# Patient Record
Sex: Male | Born: 1980 | Race: White | Hispanic: No | Marital: Married | Smoking: Never smoker
Health system: Southern US, Community
[De-identification: ages and names within clinical notes are randomized; demographics above are authoritative.]

## PROBLEM LIST (undated history)

## (undated) DIAGNOSIS — S61019A Laceration without foreign body of unspecified thumb without damage to nail, initial encounter: Secondary | ICD-10-CM

## (undated) DIAGNOSIS — Z8614 Personal history of Methicillin resistant Staphylococcus aureus infection: Secondary | ICD-10-CM

## (undated) DIAGNOSIS — Z87898 Personal history of other specified conditions: Secondary | ICD-10-CM

## (undated) HISTORY — PX: EYE SURGERY: SHX253

---

## 2004-03-20 ENCOUNTER — Emergency Department (HOSPITAL_COMMUNITY): Admission: EM | Admit: 2004-03-20 | Discharge: 2004-03-20 | Payer: Self-pay | Admitting: Emergency Medicine

## 2004-03-20 ENCOUNTER — Emergency Department (HOSPITAL_COMMUNITY): Admission: EM | Admit: 2004-03-20 | Discharge: 2004-03-20 | Payer: Self-pay | Admitting: Family Medicine

## 2004-04-13 ENCOUNTER — Emergency Department (HOSPITAL_COMMUNITY): Admission: EM | Admit: 2004-04-13 | Discharge: 2004-04-13 | Payer: Self-pay | Admitting: Family Medicine

## 2004-04-14 ENCOUNTER — Inpatient Hospital Stay (HOSPITAL_COMMUNITY): Admission: EM | Admit: 2004-04-14 | Discharge: 2004-04-17 | Payer: Self-pay | Admitting: Pulmonary Disease

## 2006-08-25 ENCOUNTER — Ambulatory Visit: Payer: Self-pay | Admitting: Pulmonary Disease

## 2006-08-29 ENCOUNTER — Ambulatory Visit: Payer: Self-pay | Admitting: Pulmonary Disease

## 2006-11-04 ENCOUNTER — Emergency Department (HOSPITAL_COMMUNITY): Admission: EM | Admit: 2006-11-04 | Discharge: 2006-11-04 | Payer: Self-pay | Admitting: Emergency Medicine

## 2007-01-10 ENCOUNTER — Ambulatory Visit: Payer: Self-pay | Admitting: Pulmonary Disease

## 2007-12-20 ENCOUNTER — Ambulatory Visit: Payer: Self-pay | Admitting: Pulmonary Disease

## 2007-12-20 DIAGNOSIS — L723 Sebaceous cyst: Secondary | ICD-10-CM

## 2007-12-20 DIAGNOSIS — L0201 Cutaneous abscess of face: Secondary | ICD-10-CM | POA: Insufficient documentation

## 2007-12-20 DIAGNOSIS — E663 Overweight: Secondary | ICD-10-CM | POA: Insufficient documentation

## 2007-12-20 DIAGNOSIS — F909 Attention-deficit hyperactivity disorder, unspecified type: Secondary | ICD-10-CM | POA: Insufficient documentation

## 2007-12-20 DIAGNOSIS — L03211 Cellulitis of face: Secondary | ICD-10-CM

## 2008-09-10 ENCOUNTER — Ambulatory Visit: Payer: Self-pay | Admitting: Internal Medicine

## 2008-09-10 ENCOUNTER — Ambulatory Visit: Payer: Self-pay | Admitting: Pulmonary Disease

## 2008-09-10 DIAGNOSIS — J209 Acute bronchitis, unspecified: Secondary | ICD-10-CM | POA: Insufficient documentation

## 2008-09-17 ENCOUNTER — Telehealth (INDEPENDENT_AMBULATORY_CARE_PROVIDER_SITE_OTHER): Payer: Self-pay | Admitting: *Deleted

## 2009-11-14 ENCOUNTER — Telehealth (INDEPENDENT_AMBULATORY_CARE_PROVIDER_SITE_OTHER): Payer: Self-pay | Admitting: *Deleted

## 2010-06-08 ENCOUNTER — Ambulatory Visit: Payer: Self-pay | Admitting: Pulmonary Disease

## 2010-09-22 NOTE — Assessment & Plan Note (Signed)
Summary: sore behind ear   CC:  OV - Knot behind left ear for past 3 days - Denies pain to touch.  History of Present Illness: 30 year old male with known history of cellulits, --sebaceous cyst, obesity and ADHD.   4/09--for an acute add on visit... he is getting married in 4 days and has developed a swelling below is right earlobe... denies f/c/s, no drainage, surrounding redness, etc... he has a hx of an infected boil on his forehead turning into a facial cellulitis after I&D at an [facility] in 2005... we had to hospitalize him for several days for IV antibiotics, and it resolved satisfactorily... he also hasa hx of an infected sebaceous cyst on the right side of his neck in the past... this resolved w/ Keflex by mouth...  September 10, 2008--Complains of 4 days of productive cough, congestion, thick mucus, nasal dishcarge. . blood tinged sputum this morning-dry dark blood. w/ rib/back pain. Feels aching., feverish. Denies chest pain, dyspnea, orthopnea, hemoptysis,  n/v/d, edema, recent travel or antibiotics.    June 08, 2010 --Presents for an acute office visit. Complains of knot behind left ear for past 3 days, noticed that a small knot was developing behind the left ear. It is larger today. no redness, sore glands. Ear feels full but no pain. He has hx of recurrent boils/cysts in past. He is concerned has a newborn at home. Denies chest pain, dyspnea, orthopnea, hemoptysis, fever, n/v/d, edema, headache, drainage, rash, sore glands, recent travel, abx use.   Current Medications (verified): 1)  None  Allergies (verified): No Known Drug Allergies  Comments:  Nurse/Medical Assistant: The patient's medications and allergies were reviewed with the patient and were updated in the Medication and Allergy Lists.  Past History:  Past Medical History: Last updated: 09/10/2008 SEBACEOUS CYST, INFECTED (ICD-706.2)--hx of an infected boil on his forehead turning into a facial cellulitis after I&D at  an [facility] in 2005... we had to hospitalize him for several days for IV antibiotics, and it resolved satisfactorily... he also has  hx of an infected sebaceous cyst on the right side of his neck in the past... this resolved w/ Keflex by mouth... Hx of CELLULITIS, FACE (ICD-682.0) Hx of DOG BITE (ICD-E906.0) OVERWEIGHT (ICD-278.02) Hx of ADHD (ICD-314.01)    Review of Systems      See HPI  Vital Signs:  Patient profile:   30 year old male Weight:      256.13 pounds O2 Sat:      96 % on Room air Temp:     97.8 degrees F oral Pulse rate:   68 / minute BP sitting:   122 / 64  (right arm) Cuff size:   large  Vitals Entered By: Abigail Miyamoto RN (June 08, 2010 4:15 PM)  O2 Flow:  Room air  Physical Exam  Additional Exam:   GEN: A/Ox3; pleasant , NAD HEENT:  [place]/AT, , EACs-clear, TMs-wnl, NOSE-clear, THROAT-clear NECK:  Supple w/ fair ROM; no JVD; normal carotid impulses w/o bruits; no thyromegaly or nodules palpated; no lymphadenopathy. CHEST:  Clear to P & A; w/o, wheezes/ rales/ or rhonchi., no wheezing.  HEART:  RRR, no m/r/g   ABDOMEN:  Soft & nt; nml bowel sounds; no organomegaly or masses detected. EXT: Warm bil,  no calf pain, edema, clubbing, pulses intact Skin: along posterior ear w/ soft cyst like lesion  ~0.5cm in size. mobile, not firm, no drainage or redness noted.      Impression & Recommendations:  Problem # 1:  SEBACEOUS CYST, INFECTED (ICD-706.2) Will tx for possible infected sebaceous cyst. hx of recurrent cyst/boils.  May need I/D for resolution, will refer to dermatology for evaluation and possible treatment and help with reoccurence prevention.  Plan:  Keflex 500mg  four times a day for 7 days Clean two times a day w/ soap and water, apply neosporin./cover Warm compresses several times a day.  We are referring you to dermatolgy to evaluate recurrent cyst and boils.  need to set up physical with Dr. Kriste Basque , call back for cpx.  Orders: Dermatology Referral  (Derma) Est. Patient Level IV (45409)  Medications Added to Medication List This Visit: 1)  Keflex 500 Mg Caps (Cephalexin) .Marland Kitchen.. 1 by mouth four times a day  Complete Medication List: 1)  Keflex 500 Mg Caps (Cephalexin) .Marland Kitchen.. 1 by mouth four times a day  Patient Instructions: 1)  Keflex 500mg  four times a day for 7 days 2)  Clean two times a day w/ soap and water, apply neosporin./cover 3)  Warm compresses several times a day.  4)  We are referring you to dermatolgy to evaluate recurrent cyst and boils.  5)  Please contact office for sooner follow up if symptoms do not improve or worsen  Prescriptions: KEFLEX 500 MG CAPS (CEPHALEXIN) 1 by mouth four times a day  #28 x 0   Entered and Authorized by:   Rubye Oaks NP   Signed by:   Rubye Oaks NP on 06/08/2010   Method used:   Electronically to        [facility] [phone]* (retail)       [address]       [place], [place]  [address]       Ph: [phone]       Fax: [phone]   RxID:   [id number]

## 2010-09-22 NOTE — Progress Notes (Signed)
Summary: diarrehea/ vomiting   LMTCBx2  Phone Note Call from Patient Call back at [phone]   Caller: Mom Call For: nadel Summary of Call: pt vomiting fever and diarrhea would like to be seen today. Initial call taken by: Rickard Patience,  November 14, 2009 8:12 AM  Follow-up for Phone Call        [facility].  Aundra Millet Reynolds LPN  November 14, 2009 9:48 AM  [facility] on home number as well as cell number given. Carron Curie CMA  November 14, 2009 4:56 PM   Additional Follow-up for Phone Call Additional follow up Details #1::        called and spoke with pt's mother.  mother states pt is now feeling better. nothing further needed.  Aundra Millet Reynolds LPN  November 18, 2009 10:42 AM

## 2011-01-05 NOTE — Assessment & Plan Note (Signed)
[facility]                             PULMONARY OFFICE NOTE   NAME:[name] Yu, [name] Yu                  MRN:          [id number]  DATE:01/10/2007                            DOB:          March 22, 1981    HISTORY OF PRESENT ILLNESS:  The patient is a 30 year old white male  patient of Dr. Lonzo Cloud. Nadel's, who presents today for a two-week  history of nasal congestion, sinus pain and pressure, a productive cough  with thick yellow sputum and a sore throat.  The patient denies any  hemoptysis, orthopnea, recent trauma or antibiotic use.   PAST MEDICAL HISTORY:  Is reviewed.   CURRENT MEDICATIONS:  Are reviewed.   PHYSICAL EXAMINATION:  GENERAL:  The patient is a pleasant male, in no  acute distress.  VITAL SIGNS:  He is afebrile with stable vital signs.  O2 saturation is  99% on room air.  HEENT:  Nasal mucosa is erythematous.  Maxillary sinus tenderness to  percussion.  Conjunctivae are not injected.  Posterior pharynx with mild  erythema, no exudate.  Tympanic membranes appear slightly full.  NECK:  Supple without adenopathy.  No jugular venous distention.  LUNGS:  Sounds are clear.  HEART:  A regular rate.  ABDOMEN:  Soft, nontender.  EXTREMITIES:  Warm without any edema.  SKIN:  Warm without rash.   IMPRESSION/PLAN:  Acute rhinosinusitis:  The patient is to begin on  Augmentin x10 days.  Add Mucinex DM b.i.d.  Nasal hygiene regimen with  saline and Afrin nasal spray x5 days.   FOLLOWUP:  The patient will return back to the office as scheduled, or  sooner if needed.      Rubye Oaks, NP  Electronically Signed      Lonzo Cloud. Kriste Basque, MD  Electronically Signed   TP/MedQ  DD: 01/10/2007  DT: 01/10/2007  Job #: [id number]

## 2011-01-08 NOTE — Discharge Summary (Signed)
NAMEMICHAIL, [name] Yu                     ACCOUNT NO.:  [id number]   MEDICAL RECORD NO.:  [id number]                   PATIENT TYPE:  INP   LOCATION:  0476                                 FACILITY:  [facility]   PHYSICIAN:  Lonzo Cloud. Kriste Basque, M.D. [facility]            DATE OF BIRTH:  12/29/1980   DATE OF ADMISSION:  04/14/2004  DATE OF DISCHARGE:  04/17/2004                                 DISCHARGE SUMMARY   FINAL DIAGNOSES:  1. Admitted April 14, 2004 from the office with an infected boil on his     forehead and facial cellulitis with severe edema.  Cultures of the wound     and blood were negative (no organism identified).  The patient responded     to IV Unasyn with clinical improvement.  ID consult, Dr. Orvan Falconer,     suggested discharge on 2 weeks of oral doxycycline.  2. History of facial surgery secondary to dog bite in the past.   BRIEF HISTORY AND PHYSICAL:  The patient is a 30 year old white man who has  been in good general health.  Several days prior to admission he noted a  red, swollen knot on his forehead that progressively worsened over several  days.  He went to a local urgent care center where they lanced the lesion  but it drained only a small amount of fluid.  He was placed on oral  Augmentin as well.  He came to the office because of fever and some chills.  He had marked increase in redness around the lesion and down to both eyes  with marked edema to the extent that he could not open his right eye and  could barely see out of his left eye.  He denied any recent trauma.  Denied  any headache or neck pain.  There was no nausea, vomiting, or change in  bowel habits.  He denied any insect or tic exposure.   The patient has enjoyed good general medical health.  He had previously  facial surgery secondary to a dog bite.  He had had a recent sebaceous cyst  drained on his neck and treated with Keflex.  This appeared to have resolved  satisfactorily.  He is on no current  medications.   Physical examination at the time of admission revealed a well-developed,  well-nourished 30 year old white man in no acute distress.  Temperature was  100.2 orally, blood pressure 134/80, pulse 72 per minute and regular,  respirations 16 per minute and not labored, O2 saturation 100% on room air.  He weighed 206 pounds.  HEENT exam revealed a red boil to the right of  midline in his forehead area.  This was somewhat fluctuant and very tender.  He had marked facial cellulitis extending down to the malar area.  He had  severe edema about the eyes and the right eyelid was swollen shut.  The left  eyelid was open sufficiently for him to see  and his vision was normal.  There was no palpable adenopathy.  Sinuses appeared slightly tender on  palpation.  Ears were negative.  No neck adenopathy.  Chest was clear to  percussion and auscultation.  Cardiac exam revealed a regular rhythm without  murmurs, rubs, or gallops heard.  The abdomen was soft and nontender without  evidence of organomegaly or masses.  Extremities showed no cyanosis,  clubbing, or edema.  There were no other skin lesions noted.  Neurologic  exam was intact.   LABORATORY DATA:  A 12-lead EKG showed normal sinus rhythm and was within  normal limits.  Hemoglobin 14.3; hematocrit 42.9; white count 12,700 with  80% segs.  Sed rate 13.  Sodium 137.  Potassium 3.2, repeat 4.2 after  potassium supplementation.  Chloride 102, CO2 28, BUN 9, creatinine 1.1.  Blood sugar 149, repeat 100.  Calcium 9.4, total protein 7.2, albumin 3.8,  AST 45, ALT 52, alk phos 72, total bilirubin 0.9.  Hemoglobin A1c 5.1.  Two  blood cultures negative and culture of the wound no growth.   HOSPITAL COURSE:  The patient was admitted with facial cellulitis from a  boil on his right forehead.  This was treated with warm soaks and Neosporin.  He was started on IV Unasyn at 1.5 g IV q.6h.  Over the next 2 days his  temperature returned to normal  and his white count improved on follow-up.  The facial cellulitis resolved and the edema disappeared.  We had  consultation from Dr. Cliffton Asters of infectious disease.  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  He recommended stopping the  Unasyn and switching to oral doxycycline for a 2-week course.  The patient  was also briefly evaluated by Dr. Darnell Level of the surgery service.  He  did not feel that this lesion needed further incision and drainage or  excision, and that it should be followed carefully on the doxycycline.   MEDICATIONS AT DISCHARGE:  Doxycycline 100 mg p.o. b.i.d. for 2 weeks.   The patient instructed to keep the lesion clean and cover it with a small  amount of Neosporin twice a day.  He will call us if the lesion gets worse  in any way, and otherwise will see him back in the office in 2 weeks to  ensure resolution of the problem.   CONDITION ON DISCHARGE:  Improved.                                               Lonzo Cloud. Kriste Basque, M.D. [facility]    SMN/MEDQ  D:  04/17/2004  T:  04/17/2004  Job:  [id number]

## 2011-01-08 NOTE — H&P (Signed)
NAMELARNCE, [name] Yu                       ACCOUNT NO.:  [id number]   MEDICAL RECORD NO.:  [id number]                   PATIENT TYPE:   LOCATION:                                       FACILITY:  [facility]   PHYSICIAN:  Lonzo Cloud. Kriste Yu, M.D. [facility]            DATE OF BIRTH:  1980-08-31   DATE OF ADMISSION:  04/14/2004  DATE OF DISCHARGE:                                HISTORY & PHYSICAL   CHIEF COMPLAINT:  Infected cyst on the forehead x3 days.   HISTORY OF PRESENT ILLNESS:  This is a 30 year old white male patient of Dr.  Kriste Yu who was in his usual good state of health up until 3 days ago.  Patient complains that he had noticed a red swollen knot on his forehead  that progressively worsened over the weekend.  Patient subsequently went to  a local Urgent Care last evening in which they performed an incision and  drainage and prescribed Augmentin.  Patient took approximately two doses  last evening and symptoms have worsened today.  The knot on his forehead has  enlarged and he has worsened redness that has spread down into his eyes.  Patient woke up this morning with his eyes swollen shut.  He has had fever,  chills, sweats with a T-max of 100.2.  Patient denies any known injury,  recent travel, headache, neck pain, nausea, vomiting, insect or tick bite.   PAST MEDICAL HISTORY:  Previous facial surgery secondary to a dog bite.  Patient has had a recent sebaceous cyst on the neck which did require  incision and drainage in August.  He was treated with antibiotics and this  healed up nicely.   CURRENT MEDICATIONS:  None.   DRUG ALLERGIES:  No known drug allergies.   SOCIAL HISTORY:  Patient is single.  He works in [employer].  Lives at  home with his mom and dad.  Denies any alcohol, drugs, or smoking.   REVIEW OF SYSTEMS:  Essentially negative except as above.   PHYSICAL EXAMINATION:  GENERAL:  Patient is a well-developed, well-nourished  white male in no acute distress.  VITAL  SIGNS:  Temperature 100.2, blood pressure 134/82, respiratory rate 16,  heart rate 73, weight is at 206.  O2 saturation is 100% on room air.  HEENT:  Patient has a red swollen nodule along the right to mid forehead.  There is significant erythema and edema surrounding this.  Upper eyelids are  swollen and erythematous and hot and tender to touch.  PERRL.  EOMI without  nystagmus.  Nasal mucosa is pink and moist.  TMs are normal.  Posterior  pharynx is clear without any exudate or swelling.  NECK:  Supple without cervical adenopathy.  No JVD.  Negative nuchal  rigidity.  LUNGS:  Lung sounds are clear to auscultation bilaterally without any  wheezing or crackles.  CARDIAC:  S1, S2 without murmur, rub, or gallop.  ABDOMEN:  Soft with  positive bowel sounds through all four quadrants.  No  hepatosplenomegaly.  No guarding or rebound noted.  EXTREMITIES:  Warm without any calf tenderness, cyanosis, clubbing, or  edema.  SKIN:  Warm without any rash.  NEUROLOGIC:  Alert and oriented x3.   IMPRESSION AND PLAN:  Facial __________ secondary to an infected sebaceous  cyst.  Patient will be admitted for IV antibiotics, Unasyn.  Laboratories  and blood culture and wound cultures are currently pending.  We will contact  infectious disease for a consult at their convenience.     Tammy Parrett, P.A. [facility]                   [name] Yu, M.D. [facility]    TP/MEDQ  D:  04/14/2004  T:  04/14/2004  Job:  [id number]

## 2011-01-08 NOTE — Assessment & Plan Note (Signed)
[facility]                             PULMONARY OFFICE NOTE   NAME:Ciszewski, ALANI LACIVITA                  MRN:          [id number]  DATE:08/25/2006                            DOB:          November 01, 1980    HISTORY OF PRESENT ILLNESS:  The patient is a 30 year old white male  patient of Dr. Jodelle Green who presents for an acute office visit.  The  patient reports he was in his general state of good health up until  yesterday when he noticed a small, tender sore on the right side of his  neck.  The patient has had a previous history of infected sebaceous cyst  that required refractory to outpatient antibiotics.  The patient  required hospitalization for IV antibiotics and excision in August of  2005.  The patient has had no reoccurrence until present.  The patient  has noticed a small tender reddened sore along the right side of his  neck yesterday.  The patient denies any fever, chest pain, shortness of  breath, neck stiffness, nausea, vomiting, or rash.   PAST MEDICAL HISTORY:  Reviewed.   CURRENT MEDICATIONS:  Reviewed.   PHYSICAL EXAMINATION:  The patient is a pleasant male in no acute  distress.  He is afebrile, stable vital signs, O2 saturation is 99% on  room air.  HEENT:  Posterior pharynx is clear. TMs are normal.  NECK:  Supple.  The patient has a small reddened tender nodule on the  right lateral neck measuring 1 sonometer x 1 sonometer.  There is some  surrounding erythema and some posterior cervical adenopathy.  Negative  nuchal rigidity.  LUNG:  Sounds are clear.  CARDIAC:  Regular rate and rhythm.  ABDOMEN:  Soft without any hepatosplenomegaly.  EXTREMITIES:  Warm without any edema.  SKIN:  Warm without rash.   IMPRESSION/PLAN:  Infected sebaceous cyst along the right lateral neck.  The patient is to being Duricef 500 mg twice daily times 7 days.  Warm  compresses to the area p.r.n.  Patient is to wash area with soap and  water and keep  clean and dry.  May apply Neosporin daily.  The  patient will recheck here in 4 days or sooner if needed.  The patient  may need referral to surgeon for I/D if it continues to enlarge.     Rubye Oaks, NP  Electronically Signed      Lonzo Cloud. Kriste Basque, MD  Electronically Signed   TP/MedQ  DD: 08/25/2006  DT: 08/25/2006  Job #: [id number]

## 2016-09-09 ENCOUNTER — Emergency Department (HOSPITAL_BASED_OUTPATIENT_CLINIC_OR_DEPARTMENT_OTHER)
Admission: EM | Admit: 2016-09-09 | Discharge: 2016-09-09 | Disposition: A | Discharge status: Home / Self Care | Payer: BC Managed Care – PPO | Attending: Emergency Medicine | Admitting: Emergency Medicine

## 2016-09-09 ENCOUNTER — Emergency Department (HOSPITAL_BASED_OUTPATIENT_CLINIC_OR_DEPARTMENT_OTHER): Payer: BC Managed Care – PPO

## 2016-09-09 ENCOUNTER — Encounter (HOSPITAL_BASED_OUTPATIENT_CLINIC_OR_DEPARTMENT_OTHER): Payer: Self-pay

## 2016-09-09 DIAGNOSIS — Y92009 Unspecified place in unspecified non-institutional (private) residence as the place of occurrence of the external cause: Secondary | ICD-10-CM | POA: Insufficient documentation

## 2016-09-09 DIAGNOSIS — S62522A Displaced fracture of distal phalanx of left thumb, initial encounter for closed fracture: Secondary | ICD-10-CM | POA: Insufficient documentation

## 2016-09-09 DIAGNOSIS — Y939 Activity, unspecified: Secondary | ICD-10-CM | POA: Diagnosis not present

## 2016-09-09 DIAGNOSIS — S62525B Nondisplaced fracture of distal phalanx of left thumb, initial encounter for open fracture: Secondary | ICD-10-CM

## 2016-09-09 DIAGNOSIS — Y999 Unspecified external cause status: Secondary | ICD-10-CM | POA: Insufficient documentation

## 2016-09-09 DIAGNOSIS — S61019A Laceration without foreign body of unspecified thumb without damage to nail, initial encounter: Secondary | ICD-10-CM

## 2016-09-09 DIAGNOSIS — W010XXA Fall on same level from slipping, tripping and stumbling without subsequent striking against object, initial encounter: Secondary | ICD-10-CM | POA: Diagnosis not present

## 2016-09-09 DIAGNOSIS — S6992XA Unspecified injury of left wrist, hand and finger(s), initial encounter: Secondary | ICD-10-CM | POA: Diagnosis present

## 2016-09-09 HISTORY — DX: Laceration without foreign body of unspecified thumb without damage to nail, initial encounter: S61.019A

## 2016-09-09 MED ORDER — TETANUS-DIPHTH-ACELL PERTUSSIS 5-2.5-18.5 LF-MCG/0.5 IM SUSP
0.5000 mL | Freq: Once | INTRAMUSCULAR | Status: AC
  Administered 2016-09-09: 0.5 mL via INTRAMUSCULAR
  Filled 2016-09-09: qty 0.5

## 2016-09-09 MED ORDER — HYDROCODONE-ACETAMINOPHEN 5-325 MG PO TABS
2.0000 | ORAL_TABLET | Freq: Once | ORAL | Status: AC
  Administered 2016-09-09: 2 via ORAL
  Filled 2016-09-09: qty 2

## 2016-09-09 MED ORDER — LIDOCAINE HCL (PF) 1 % IJ SOLN
INTRAMUSCULAR | Status: AC
  Administered 2016-09-09: 5 mL
  Filled 2016-09-09: qty 5

## 2016-09-09 MED ORDER — HYDROCODONE-ACETAMINOPHEN 5-325 MG PO TABS: 1.0000 | ORAL_TABLET | Freq: Four times a day (QID) | ORAL | 0 refills | Status: AC | PRN

## 2016-09-09 MED ORDER — CEPHALEXIN 500 MG PO CAPS: 500.0000 mg | ORAL_CAPSULE | Freq: Four times a day (QID) | ORAL | 0 refills | Status: AC

## 2016-09-09 MED FILL — CEPHALEXIN 500 MG CAPSULE: 500 | 7 days supply | Qty: 28 | Fill #0

## 2016-09-09 MED FILL — HYDROCODON-APAP 5-325: 5-325 | 2 days supply | Qty: 20 | Fill #0

## 2016-09-09 NOTE — ED Provider Notes (Signed)
[facility] Provider Note   CSN: [id number] Arrival date & time: 09/09/16  1230     History   Chief Complaint Chief Complaint  Patient presents with  . Hand Injury    HPI [name] Yu is a 36 y.o. male.  Patient is a 36 year old male with no significant past medical history. He presents for evaluation of a thumb injury. He reports shoveling the driveway, and slipping on ice. He fell and sliced his thumb on the snow shovel.   The history is provided by the patient.  Hand Injury   The incident occurred less than 1 hour ago. The incident occurred at home. The injury mechanism was a fall. Pain location: Left thumb. The pain is moderate. The pain has been constant since the incident. He reports no foreign bodies present. The symptoms are aggravated by movement. He has tried nothing for the symptoms.    History reviewed. No pertinent past medical history.  Patient Active Problem List   Diagnosis Date Noted  . BRONCHITIS, ACUTE 09/10/2008  . OVERWEIGHT 12/20/2007  . ADHD 12/20/2007  . CELLULITIS, FACE 12/20/2007  . SEBACEOUS CYST, INFECTED 12/20/2007    History reviewed. No pertinent surgical history.     Home Medications    Prior to Admission medications   Not on File    Family History No family history on file.  Social History Social History  Substance Use Topics  . Smoking status: Never Smoker  . Smokeless tobacco: Never Used  . Alcohol use Yes     Comment: occ     Allergies   Patient has no known allergies.   Review of Systems Review of Systems  All other systems reviewed and are negative.    Physical Exam Updated Vital Signs BP 140/98 (BP Location: Right Arm)   Pulse 86   Temp 98.4 F (36.9 C) (Oral)   Resp 20   Ht 6\' 1"  (1.854 m)   Wt 275 lb (124.7 kg)   SpO2 98%   BMI 36.28 kg/m   Physical Exam  Constitutional: He is oriented to person, place, and time. He appears well-developed and well-nourished. No distress.    HENT:  Head: Normocephalic and atraumatic.  Neck: Normal range of motion. Neck supple.  Musculoskeletal: Normal range of motion.  The left thumb has a 1.5 cm laceration with arterial bleeding overlying lateral aspect of the IP joint.  Neurological: He is alert and oriented to person, place, and time.  Skin: Skin is warm and dry. He is not diaphoretic.  Nursing note and vitals reviewed.    ED Treatments / Results  Labs (all labs ordered are listed, but only abnormal results are displayed) Labs Reviewed - No data to display  EKG  EKG Interpretation None       Radiology No results found.  Procedures Procedures (including critical care time)  Medications Ordered in ED Medications - No data to display   Initial Impression / Assessment and Plan / ED Course  I have reviewed the triage vital signs and the nursing notes.  Pertinent labs & imaging results that were available during my care of the patient were reviewed by me and considered in my medical decision making (see chart for details).  Patient presents here with complaints of a thumb injury. He lacerated his thumb while shoveling snow. He slipped on the ice and landed on the sharp edge of his snow shovel. He presented here with a laceration over the IP joint with arterial bleeding. X-rays  reveal a fracture of the distal phalanx.  I spoke with Dr. Merlyn LotKuzma from hand surgery regarding this injury. He feels as though closure of the wound with irrigation and follow-up in the office is appropriate. He will be treated with Keflex, hydrocodone, local wound care, and follow-up tomorrow.  LACERATION REPAIR Performed by: Geoffery LyonseLo, Kharisma Glasner Authorized by: Geoffery LyonseLo, Ralph Benavidez Consent: Verbal consent obtained. Risks and benefits: risks, benefits and alternatives were discussed Consent given by: patient Patient identity confirmed: provided demographic data Prepped and Draped in normal sterile fashion Wound explored  Laceration Location: Left  thumb  Laceration Length: 2.5 cm  No Foreign Bodies seen or palpated  Anesthesia: local infiltration  Local anesthetic: lidocaine 1 % without epinephrine  Anesthetic total: 3 ml  Irrigation method: syringe Amount of cleaning: standard  Skin closure: 4-0 Ethilon   Number of sutures: 5   Technique: Simple interrupted   Patient tolerance: Patient tolerated the procedure well with no immediate complications.   Final Clinical Impressions(s) / ED Diagnoses   Final diagnoses:  None    New Prescriptions New Prescriptions   No medications on file     Geoffery Lyonsouglas Prairie Stenberg, MD 09/09/16 1521

## 2016-09-09 NOTE — ED Triage Notes (Signed)
Cut left thumb on shovel approx 10 min PTA-NAD-steady gait

## 2016-09-09 NOTE — Discharge Instructions (Signed)
Dressing and splint as applied until followed up by hand surgery.  You are to follow-up with Dr. Merlyn LotKuzma tomorrow. Please call this afternoon or early tomorrow to arrange this appointment. His contact information has been provided in this discharge summary.  Keflex as prescribed.  Hydrocodone as prescribed as needed for pain.

## 2016-09-10 ENCOUNTER — Encounter (HOSPITAL_BASED_OUTPATIENT_CLINIC_OR_DEPARTMENT_OTHER): Payer: Self-pay | Admitting: *Deleted

## 2016-09-14 ENCOUNTER — Encounter (HOSPITAL_BASED_OUTPATIENT_CLINIC_OR_DEPARTMENT_OTHER)
Admission: RE | Disposition: A | Discharge status: Home / Self Care | Payer: Self-pay | Source: Ambulatory Visit | Attending: Orthopedic Surgery

## 2016-09-14 ENCOUNTER — Encounter (HOSPITAL_BASED_OUTPATIENT_CLINIC_OR_DEPARTMENT_OTHER): Payer: Self-pay | Admitting: Anesthesiology

## 2016-09-14 ENCOUNTER — Ambulatory Visit (HOSPITAL_BASED_OUTPATIENT_CLINIC_OR_DEPARTMENT_OTHER)
Admission: RE | Admit: 2016-09-14 | Discharge: 2016-09-14 | Disposition: A | Discharge status: Home / Self Care | Payer: BC Managed Care – PPO | Source: Ambulatory Visit | Attending: Orthopedic Surgery | Admitting: Orthopedic Surgery

## 2016-09-14 ENCOUNTER — Ambulatory Visit (HOSPITAL_BASED_OUTPATIENT_CLINIC_OR_DEPARTMENT_OTHER): Payer: BC Managed Care – PPO | Admitting: Anesthesiology

## 2016-09-14 DIAGNOSIS — Y93H1 Activity, digging, shoveling and raking: Secondary | ICD-10-CM | POA: Diagnosis not present

## 2016-09-14 DIAGNOSIS — S65112A Laceration of radial artery at wrist and hand level of left arm, initial encounter: Secondary | ICD-10-CM | POA: Diagnosis not present

## 2016-09-14 DIAGNOSIS — S61012A Laceration without foreign body of left thumb without damage to nail, initial encounter: Secondary | ICD-10-CM | POA: Diagnosis present

## 2016-09-14 DIAGNOSIS — Z6835 Body mass index (BMI) 35.0-35.9, adult: Secondary | ICD-10-CM | POA: Insufficient documentation

## 2016-09-14 DIAGNOSIS — W000XXA Fall on same level due to ice and snow, initial encounter: Secondary | ICD-10-CM | POA: Diagnosis not present

## 2016-09-14 DIAGNOSIS — S63682A Other sprain of left thumb, initial encounter: Secondary | ICD-10-CM | POA: Insufficient documentation

## 2016-09-14 DIAGNOSIS — Y92008 Other place in unspecified non-institutional (private) residence as the place of occurrence of the external cause: Secondary | ICD-10-CM | POA: Diagnosis not present

## 2016-09-14 DIAGNOSIS — S66022A Laceration of long flexor muscle, fascia and tendon of left thumb at wrist and hand level, initial encounter: Secondary | ICD-10-CM | POA: Insufficient documentation

## 2016-09-14 DIAGNOSIS — S62522B Displaced fracture of distal phalanx of left thumb, initial encounter for open fracture: Secondary | ICD-10-CM | POA: Diagnosis not present

## 2016-09-14 DIAGNOSIS — Z8614 Personal history of Methicillin resistant Staphylococcus aureus infection: Secondary | ICD-10-CM | POA: Diagnosis not present

## 2016-09-14 DIAGNOSIS — S6432XA Injury of digital nerve of left thumb, initial encounter: Secondary | ICD-10-CM | POA: Diagnosis not present

## 2016-09-14 DIAGNOSIS — E669 Obesity, unspecified: Secondary | ICD-10-CM | POA: Insufficient documentation

## 2016-09-14 HISTORY — DX: Laceration without foreign body of unspecified thumb without damage to nail, initial encounter: S61.019A

## 2016-09-14 HISTORY — DX: Personal history of other specified conditions: Z87.898

## 2016-09-14 HISTORY — DX: Personal history of Methicillin resistant Staphylococcus aureus infection: Z86.14

## 2016-09-14 HISTORY — PX: NERVE, TENDON AND ARTERY REPAIR: SHX5695

## 2016-09-14 SURGERY — NERVE, TENDON AND ARTERY REPAIR
Anesthesia: General | Site: Thumb | Laterality: Left

## 2016-09-14 MED ORDER — FENTANYL CITRATE (PF) 100 MCG/2ML IJ SOLN
INTRAMUSCULAR | Status: AC
  Filled 2016-09-14: qty 2

## 2016-09-14 MED ORDER — LIDOCAINE 2% (20 MG/ML) 5 ML SYRINGE
INTRAMUSCULAR | Status: DC | PRN
  Administered 2016-09-14: 100 mg via INTRAVENOUS

## 2016-09-14 MED ORDER — OXYCODONE HCL 5 MG/5ML PO SOLN: 5.0000 mg | Freq: Once | ORAL | Status: DC | PRN

## 2016-09-14 MED ORDER — FENTANYL CITRATE (PF) 100 MCG/2ML IJ SOLN
50.0000 ug | INTRAMUSCULAR | Status: AC | PRN
  Administered 2016-09-14: 50 ug via INTRAVENOUS
  Administered 2016-09-14: 100 ug via INTRAVENOUS
  Administered 2016-09-14: 50 ug via INTRAVENOUS

## 2016-09-14 MED ORDER — HYDROMORPHONE HCL 1 MG/ML IJ SOLN: 0.2500 mg | INTRAMUSCULAR | Status: DC | PRN

## 2016-09-14 MED ORDER — MIDAZOLAM HCL 2 MG/2ML IJ SOLN
1.0000 mg | INTRAMUSCULAR | Status: DC | PRN
  Administered 2016-09-14: 2 mg via INTRAVENOUS

## 2016-09-14 MED ORDER — DEXAMETHASONE SODIUM PHOSPHATE 10 MG/ML IJ SOLN
INTRAMUSCULAR | Status: DC | PRN
Start: 2016-09-14 — End: 2016-09-14
  Administered 2016-09-14: 10 mg via INTRAVENOUS

## 2016-09-14 MED ORDER — OXYCODONE HCL 5 MG PO TABS: 5.0000 mg | ORAL_TABLET | Freq: Once | ORAL | Status: DC | PRN

## 2016-09-14 MED ORDER — CEFAZOLIN SODIUM-DEXTROSE 2-4 GM/100ML-% IV SOLN
INTRAVENOUS | Status: AC
  Filled 2016-09-14: qty 100

## 2016-09-14 MED ORDER — HYDROCODONE-ACETAMINOPHEN 5-325 MG PO TABS: ORAL_TABLET | ORAL | 0 refills | Status: AC

## 2016-09-14 MED ORDER — MIDAZOLAM HCL 2 MG/2ML IJ SOLN
INTRAMUSCULAR | Status: AC
  Filled 2016-09-14: qty 2

## 2016-09-14 MED ORDER — PROMETHAZINE HCL 25 MG/ML IJ SOLN: 6.2500 mg | INTRAMUSCULAR | Status: DC | PRN

## 2016-09-14 MED ORDER — BUPIVACAINE HCL (PF) 0.25 % IJ SOLN
INTRAMUSCULAR | Status: DC | PRN
  Administered 2016-09-14: 10 mL

## 2016-09-14 MED ORDER — CHLORHEXIDINE GLUCONATE 4 % EX LIQD: 60.0000 mL | Freq: Once | CUTANEOUS | Status: DC

## 2016-09-14 MED ORDER — DEXTROSE 5 % IV SOLN
3.0000 g | INTRAVENOUS | Status: AC
  Administered 2016-09-14: 3 g via INTRAVENOUS

## 2016-09-14 MED ORDER — CEFAZOLIN IN D5W 1 GM/50ML IV SOLN
INTRAVENOUS | Status: AC
  Filled 2016-09-14: qty 50

## 2016-09-14 MED ORDER — ONDANSETRON HCL 4 MG/2ML IJ SOLN
INTRAMUSCULAR | Status: DC | PRN
  Administered 2016-09-14: 4 mg via INTRAVENOUS

## 2016-09-14 MED ORDER — PROPOFOL 10 MG/ML IV BOLUS
INTRAVENOUS | Status: DC | PRN
  Administered 2016-09-14: 200 mg via INTRAVENOUS

## 2016-09-14 MED ORDER — LACTATED RINGERS IV SOLN
INTRAVENOUS | Status: DC
  Administered 2016-09-14 (×2): via INTRAVENOUS

## 2016-09-14 MED ORDER — MEPERIDINE HCL 25 MG/ML IJ SOLN: 6.2500 mg | INTRAMUSCULAR | Status: DC | PRN

## 2016-09-14 MED ORDER — SCOPOLAMINE 1 MG/3DAYS TD PT72
1.0000 | MEDICATED_PATCH | Freq: Once | TRANSDERMAL | Status: DC | PRN
Start: 2016-09-14 — End: 2016-09-14

## 2016-09-14 SURGICAL SUPPLY — 66 items
BAG DECANTER FOR FLEXI CONT (MISCELLANEOUS) IMPLANT
BANDAGE ACE 3X5.8 VEL STRL LF (GAUZE/BANDAGES/DRESSINGS) ×2 IMPLANT
BLADE MINI RND TIP GREEN BEAV (BLADE) IMPLANT
BLADE SURG 15 STRL LF DISP TIS (BLADE) ×2 IMPLANT
BLADE SURG 15 STRL SS (BLADE) ×4
BNDG CMPR 9X4 STRL LF SNTH (GAUZE/BANDAGES/DRESSINGS) ×1
BNDG ESMARK 4X9 LF (GAUZE/BANDAGES/DRESSINGS) ×1 IMPLANT
BNDG GAUZE ELAST 4 BULKY (GAUZE/BANDAGES/DRESSINGS) ×2 IMPLANT
BRUSH SCRUB EZ PLAIN DRY (MISCELLANEOUS) ×2 IMPLANT
CATH ROBINSON RED A/P 10FR (CATHETERS) IMPLANT
CHLORAPREP W/TINT 26ML (MISCELLANEOUS) ×2 IMPLANT
CORDS BIPOLAR (ELECTRODE) ×2 IMPLANT
COVER BACK TABLE 60X90IN (DRAPES) ×2 IMPLANT
COVER MAYO STAND STRL (DRAPES) ×2 IMPLANT
CUFF TOURNIQUET SINGLE 18IN (TOURNIQUET CUFF) ×1 IMPLANT
DECANTER SPIKE VIAL GLASS SM (MISCELLANEOUS) ×2 IMPLANT
DRAPE EXTREMITY T 121X128X90 (DRAPE) ×2 IMPLANT
DRAPE SURG 17X23 STRL (DRAPES) ×2 IMPLANT
GAUZE SPONGE 4X4 12PLY STRL (GAUZE/BANDAGES/DRESSINGS) ×2 IMPLANT
GAUZE XEROFORM 1X8 LF (GAUZE/BANDAGES/DRESSINGS) ×2 IMPLANT
GLOVE BIO SURGEON STRL SZ 6.5 (GLOVE) ×1 IMPLANT
GLOVE BIO SURGEON STRL SZ7.5 (GLOVE) ×2 IMPLANT
GLOVE BIOGEL PI IND STRL 7.0 (GLOVE) IMPLANT
GLOVE BIOGEL PI IND STRL 8 (GLOVE) ×1 IMPLANT
GLOVE BIOGEL PI IND STRL 8.5 (GLOVE) IMPLANT
GLOVE BIOGEL PI INDICATOR 7.0 (GLOVE) ×2
GLOVE BIOGEL PI INDICATOR 8 (GLOVE) ×1
GLOVE BIOGEL PI INDICATOR 8.5 (GLOVE) ×2
GLOVE SURG ORTHO 8.0 STRL STRW (GLOVE) ×1 IMPLANT
GOWN STRL REUS W/ TWL LRG LVL3 (GOWN DISPOSABLE) ×1 IMPLANT
GOWN STRL REUS W/TWL LRG LVL3 (GOWN DISPOSABLE) ×2
GOWN STRL REUS W/TWL XL LVL3 (GOWN DISPOSABLE) ×2 IMPLANT
LOOP VESSEL MAXI BLUE (MISCELLANEOUS) IMPLANT
NDL HYPO 25X1 1.5 SAFETY (NEEDLE) IMPLANT
NDL SAFETY ECLIPSE 18X1.5 (NEEDLE) IMPLANT
NEEDLE HYPO 18GX1.5 SHARP (NEEDLE)
NEEDLE HYPO 25X1 1.5 SAFETY (NEEDLE) IMPLANT
NS IRRIG 1000ML POUR BTL (IV SOLUTION) ×2 IMPLANT
PACK BASIN DAY SURGERY FS (CUSTOM PROCEDURE TRAY) ×2 IMPLANT
PAD CAST 3X4 CTTN HI CHSV (CAST SUPPLIES) ×1 IMPLANT
PAD CAST 4YDX4 CTTN HI CHSV (CAST SUPPLIES) IMPLANT
PADDING CAST ABS 4INX4YD NS (CAST SUPPLIES)
PADDING CAST ABS COTTON 4X4 ST (CAST SUPPLIES) ×1 IMPLANT
PADDING CAST COTTON 3X4 STRL (CAST SUPPLIES) ×2
PADDING CAST COTTON 4X4 STRL (CAST SUPPLIES)
SLEEVE SCD COMPRESS KNEE MED (MISCELLANEOUS) IMPLANT
SPEAR EYE SURG WECK-CEL (MISCELLANEOUS) ×2 IMPLANT
SPLINT PLASTER CAST XFAST 3X15 (CAST SUPPLIES) IMPLANT
SPLINT PLASTER XTRA FASTSET 3X (CAST SUPPLIES) ×30
STOCKINETTE 4X48 STRL (DRAPES) ×2 IMPLANT
SUT ETHIBOND 3-0 V-5 (SUTURE) IMPLANT
SUT ETHILON 4 0 PS 2 18 (SUTURE) ×2 IMPLANT
SUT FIBERWIRE 4-0 18 TAPR NDL (SUTURE)
SUT MERSILENE 6 0 P 1 (SUTURE) IMPLANT
SUT NYLON 9 0 VRM6 (SUTURE) IMPLANT
SUT PROLENE 6 0 P 1 18 (SUTURE) IMPLANT
SUT SILK 4 0 PS 2 (SUTURE) ×1 IMPLANT
SUT SUPRAMID 4-0 (SUTURE) IMPLANT
SUT VICRYL 4-0 PS2 18IN ABS (SUTURE) IMPLANT
SUTURE FIBERWR 4-0 18 TAPR NDL (SUTURE) IMPLANT
SYR BULB 3OZ (MISCELLANEOUS) ×2 IMPLANT
SYR CONTROL 10ML LL (SYRINGE) ×1 IMPLANT
TOWEL OR 17X24 6PK STRL BLUE (TOWEL DISPOSABLE) ×4 IMPLANT
TOWEL OR NON WOVEN STRL DISP B (DISPOSABLE) ×1 IMPLANT
TRAY DSU PREP LF (CUSTOM PROCEDURE TRAY) IMPLANT
UNDERPAD 30X30 (UNDERPADS AND DIAPERS) ×2 IMPLANT

## 2016-09-14 NOTE — Anesthesia Preprocedure Evaluation (Signed)
Anesthesia Evaluation  Patient identified by MRN, date of birth, ID band Patient awake    Reviewed: Allergy & Precautions, NPO status , Patient's Chart, lab work & pertinent test results  Airway Mallampati: II  TM Distance: >3 FB Neck ROM: Full    Dental no notable dental hx.    Pulmonary neg pulmonary ROS,    Pulmonary exam normal breath sounds clear to auscultation       Cardiovascular negative cardio ROS Normal cardiovascular exam Rhythm:Regular Rate:Normal     Neuro/Psych negative neurological ROS  negative psych ROS   GI/Hepatic negative GI ROS, Neg liver ROS,   Endo/Other  negative endocrine ROS  Renal/GU negative Renal ROS     Musculoskeletal negative musculoskeletal ROS (+)   Abdominal (+) + obese,   Peds  Hematology negative hematology ROS (+)   Anesthesia Other Findings   Reproductive/Obstetrics                             Anesthesia Physical Anesthesia Plan  ASA: II  Anesthesia Plan: General   Post-op Pain Management:    Induction: Intravenous  Airway Management Planned: LMA  Additional Equipment:   Intra-op Plan:   Post-operative Plan: Extubation in OR  Informed Consent: I have reviewed the patients History and Physical, chart, labs and discussed the procedure including the risks, benefits and alternatives for the proposed anesthesia with the patient or authorized representative who has indicated his/her understanding and acceptance.   Dental advisory given  Plan Discussed with: CRNA  Anesthesia Plan Comments:         Anesthesia Quick Evaluation

## 2016-09-14 NOTE — Brief Op Note (Signed)
09/14/2016  4:24 PM  PATIENT:  Carlis Stablehristopher L Yi  36 y.o. male  PRE-OPERATIVE DIAGNOSIS:  LEFT THUMB LACERATION  POST-OPERATIVE DIAGNOSIS:  LEFT THUMB LACERATION  PROCEDURE:  Procedure(s): LEFT THUMB EXPLORATION WOUND REPAIR , TENDON, ARTERY, AND NERVE REPAIR (Left)  SURGEON:  Surgeon(s) and Role:    * Betha LoaKevin Lennard Capek, MD - Primary    * Cindee SaltGary Jemia Fata, MD - Assisting  PHYSICIAN ASSISTANT:   ASSISTANTS: Cindee SaltGary [place], MD   ANESTHESIA:   general  EBL:  Total I/O In: 1000 [I.V.:1000] Out: -   BLOOD ADMINISTERED:none  DRAINS: none   LOCAL MEDICATIONS USED:  MARCAINE     SPECIMEN:  No Specimen  DISPOSITION OF SPECIMEN:  N/A  COUNTS:  YES  TOURNIQUET:   Total Tourniquet Time Documented: Upper Arm (Left) - 62 minutes Total: Upper Arm (Left) - 62 minutes   DICTATION: .Other Dictation: Dictation Number [id number]  PLAN OF CARE: Discharge to home after PACU  PATIENT DISPOSITION:  PACU - hemodynamically stable.

## 2016-09-14 NOTE — Discharge Instructions (Addendum)

## 2016-09-14 NOTE — H&P (Signed)
  [name] [name] is an 36 y.o. male.   Chief Complaint: left thumb laceration HPI: 36 yo rhd male states he lacerated left thumb when he fell while shoveling driveway last week.  Seen in ED where wound cleaned and sutured.  Followed up in office.  Allergies: No Known Allergies  Past Medical History:  Diagnosis Date  . History of febrile seizure    as a child  . History of MRSA infection ~ 2004   neck  . Thumb laceration 09/09/2016   left    Past Surgical History:  Procedure Laterality Date  . EYE SURGERY     tear duct repair - was attacked by a dog    Family History: History reviewed. No pertinent family history.  Social History:   reports that he has never smoked. He has never used smokeless tobacco. He reports that he drinks alcohol. He reports that he does not use drugs.  Medications: Medications Prior to Admission  Medication Sig Dispense Refill  . cephALEXin (KEFLEX) 500 MG capsule Take 1 capsule (500 mg total) by mouth 4 (four) times daily. 28 capsule 0  . HYDROcodone-acetaminophen (NORCO) 5-325 MG tablet Take 1-2 tablets by mouth every 6 (six) hours as needed. 20 tablet 0    No results found for this or any previous visit (from the past 48 hour(s)).  No results found.   A comprehensive review of systems was negative.  Blood pressure (!) 142/95, pulse 67, temperature 98.4 F (36.9 C), temperature source Oral, height 6\' 1"  (1.854 m), weight 123.4 kg (272 lb 2 oz), SpO2 100 %.  General appearance: alert, cooperative and appears stated age Head: Normocephalic, without obvious abnormality, atraumatic Neck: supple, symmetrical, trachea midline Resp: clear to auscultation bilaterally Cardio: regular rate and rhythm GI: non-tender Extremities: Intact sensation and capillary refill all digits.  +epl/fpl/io.  Left thumb laceration at level of ip joint volarly.  Unable to fully flex ip joint. Pulses: 2+ and symmetric Skin: Skin color, texture, turgor normal. No  rashes or lesions Neurologic: Grossly normal Incision/Wound:as above  Assessment/Plan Left thumb laceration with tendon/artery/nerve laceration.  Risks, benefits, and alternatives of surgery were discussed and the patient agrees with the plan of care.   [name] [name] R 09/14/2016, 2:36 PM

## 2016-09-14 NOTE — Op Note (Signed)
[id number] 

## 2016-09-14 NOTE — Transfer of Care (Signed)
Immediate Anesthesia Transfer of Care Note  Patient: [name] StableChristopher L [name]  Procedure(s) Performed: Procedure(s): LEFT THUMB EXPLORATION WOUND REPAIR , TENDON, ARTERY, AND NERVE REPAIR (Left)  Patient Location: PACU  Anesthesia Type:General  Level of Consciousness: awake  Airway & Oxygen Therapy: Patient Spontanous Breathing and Patient connected to face mask oxygen  Post-op Assessment: Report given to RN and Post -op Vital signs reviewed and [name]  Post vital signs: Reviewed and [name]  Last Vitals:  Vitals:   09/14/16 1331  BP: (!) 142/95  Pulse: 67  Temp: 36.9 C    Last Pain:  Vitals:   09/14/16 1331  TempSrc: Oral  PainSc: 7          Complications: No apparent anesthesia complications

## 2016-09-14 NOTE — Op Note (Signed)
I assisted Surgeon(s) and Role:    * Betha LoaKevin Sakari Alkhatib, MD - Primary    * Cindee SaltGary Tayana Shankle, MD - Assisting on the Procedure(s): LEFT THUMB EXPLORATION WOUND REPAIR , TENDON, ARTERY, AND NERVE REPAIR on 09/14/2016.  I provided assistance on this case as follows: approach, debridement, identification of injury of tendon, ligament nerve and artery, repair of tendon and ligament, repair of artery and nerve using the microscope, closure of the wound, application of the dressing and splint. I was present for the entire case.  Electronically signed by: Nicki ReaperKUZMA,Carmelo Reidel R, MD Date: 09/14/2016 Time: 4:23 PM

## 2016-09-14 NOTE — Anesthesia Procedure Notes (Signed)
Procedure Name: LMA Insertion Date/Time: 09/14/2016 2:59 PM Performed by: Caren MacadamARTER, Liyana Suniga W Pre-anesthesia Checklist: Patient identified, Emergency Drugs available, Suction available and Patient being monitored Patient Re-evaluated:Patient Re-evaluated prior to inductionOxygen Delivery Method: Circle system utilized Preoxygenation: Pre-oxygenation with 100% oxygen Intubation Type: IV induction Ventilation: Mask ventilation without difficulty LMA: LMA inserted LMA Size: 5.0 Number of attempts: 1 Airway Equipment and Method: Bite block Placement Confirmation: positive ETCO2 and breath sounds checked- equal and bilateral Tube secured with: Tape Dental Injury: Teeth and Oropharynx as per pre-operative assessment

## 2016-09-15 ENCOUNTER — Encounter (HOSPITAL_BASED_OUTPATIENT_CLINIC_OR_DEPARTMENT_OTHER): Payer: Self-pay | Admitting: Orthopedic Surgery

## 2016-09-15 NOTE — Op Note (Signed)
[name], [name]           ACCOUNT NO.:  [id number]  MEDICAL RECORD NO.:  [id number]  LOCATION:                                 FACILITY:  PHYSICIAN:  Betha LoaKevin Damarious Holtsclaw, MD             DATE OF BIRTH:  DATE OF PROCEDURE:  09/14/2016 DATE OF DISCHARGE:                              OPERATIVE REPORT   PREOPERATIVE DIAGNOSIS:  Left thumb laceration with possible tendon, artery, and nerve laceration.  POSTOPERATIVE DIAGNOSES:  Left thumb laceration with flexor pollicis longus laceration, radial collateral ligament laceration, radial digital nerve laceration, radial digital artery laceration, open distal phalanx fracture.  PROCEDURES:   1. Left thumb exploration of wound with 2. Left thumb repair of flexor pollicis longus tendon zone 1 3. Left thumb repair of radial digital artery under microscope 4. Left thumb repair of radial digital nerve under microscope 5. Left thumb repair of radial collateral ligament 6. Left thumb irrigation and debridement of open distal phalanx fracture and IP joint including skin, subcutaneous tissues, muscle and bone  7. Left thumb open treatment of distal phalanx fracture.  SURGEON:  Betha LoaKevin Erik Nessel, MD.  ASSISTANT:  Cindee SaltGary Dalan Cowger, MD.  ANESTHESIA:  General.  IV FLUIDS:  Per anesthesia flow sheet.  ESTIMATED BLOOD LOSS:  Minimal.  COMPLICATIONS:  None.  SPECIMENS:  None.  TOURNIQUET TIME:  62 minutes.  DISPOSITION:  Stable to PACU.  INDICATIONS:  [name] Yu is a 36 year old male who was shoveling snow last week when he slipped and fell injuring his left thumb.  He was seen at the emergency department where his wound was clean and sutured closed.  He followed up in the office.  I recommended operative exploration of the wound with repair of tendon, artery, and nerve as necessary.  Risks, benefits, and alternative of surgery were discussed including the risk of blood loss; infection; damage to nerves, vessels, tendons, ligaments, bone; failure of  surgery; need for additional surgery; complications with wound healing; continued pain and stiffness. He voiced understanding of these risks and elected to proceed.  OPERATIVE COURSE:  After being identified preoperatively by myself, the patient and I agreed upon procedure and site of procedure.  Surgical site was marked.  The risks, benefits, and alternatives of surgery were reviewed and he wished to proceed.  Surgical consent had been signed. He was given IV Ancef as preoperative antibiotic prophylaxis.  He was transported to the operating room and placed on the operating room table in supine position with the left upper extremity on arm board.  General anesthesia induced by anesthesiologist.  The left upper extremity was prepped and draped in normal sterile orthopedic fashion.  A surgical pause was performed between surgeons, anesthesia, and operating room staff and all were in agreement as to the patient, procedure, and site of procedure.  Tourniquet at the proximal aspect of the extremity was inflated to 250 mmHg after exsanguination of the limb with an Esmarch bandage.  The sutures had been removed prior to prepping and draping. The wound was extended proximally and distally in a mid lateral fashion. The wound was explored.  There was laceration of the FPL tendon in its entirety with the exception  of a few small fibers at the ulnar side. The ulnar digital nerve and artery were outside the zone of injury as the laceration went only halfway across the thumb.  The radial digital nerve and artery were 100% lacerated.  There was laceration to the radial collateral ligament of the IP joint.  The IP joint was open. There was a fracture line into the distal phalanx.  There was no gross contamination.  The wound was copiously irrigated with sterile saline. It was debrided with the scissors including the skin and subcutaneous tissues.  Loose fragment of bone was removed from the fracture  site.  A 2-0 Prolene suture was then weaved through the FPL tendon and passed over the dorsum of the thumb and over a button.  This reapproximated the FPL tendon to its stump and to the distal phalanx nicely.  The radial collateral ligament was repaired with a 4-0 Vicryl suture in a figure-of- eight fashion.  This reapproximated the collateral ligament well.  The distal phalanx fracture was maintained in acceptable reduction.  The fracture appeared to be in the coronal plane mostly.  The microscope was brought in.  The proximal and distal stumps of the radial digital artery were identified.  The lumen was cleared.  A 9-0 nylon suture was used in an interrupted circumferential fashion to repair the artery.  Good reapposition of the arterial ends was obtained.  The 9-0 nylon suture was then used to reapproximate the radial digital nerve.  Good reapproximation was obtained.  The nerve was large in caliber.  The wound was then closed with 4-0 nylon in a horizontal mattress fashion. It was dressed with sterile Xeroform, 4x4s, and wrapped with a Kerlix bandage.  A digital block had been performed with 10 mL of 0.25% plain Marcaine to aid in postoperative analgesia.  A dorsal blocking splint was placed with the wrist flexed approximately 30-40 degrees and the thumb flexed at the MP joint in mid palmar adduction.  The tourniquet was deflated at 62 minutes.  The fingertips were pink with brisk capillary refill after deflation of the tourniquet.  The operative drapes were broken down and the patient was awoken from anesthesia safely.  He was transferred back to stretcher and taken to PACU in stable condition.  I will see him back in the office in 1 week for postoperative followup.  I will give him Norco 5/325 one to two p.o. q.6 hours p.r.n. pain, dispensed #30.     Betha Loa, MD     KK/MEDQ  D:  09/14/2016  T:  09/15/2016  Job:  [id number]

## 2016-09-24 NOTE — Anesthesia Postprocedure Evaluation (Addendum)
Anesthesia Post Note  Patient: Carlis StableChristopher L Derasmo  Procedure(s) Performed: Procedure(s) (LRB): LEFT THUMB EXPLORATION WOUND REPAIR , TENDON, ARTERY, AND NERVE REPAIR (Left)  Patient location during evaluation: PACU Anesthesia Type: General Level of consciousness: awake and alert Pain management: pain level controlled Vital Signs Assessment: post-procedure vital signs reviewed and stable Respiratory status: spontaneous breathing, nonlabored ventilation, respiratory function stable and patient connected to nasal cannula oxygen Cardiovascular status: blood pressure returned to baseline and stable Postop Assessment: no signs of nausea or vomiting Anesthetic complications: no       Last Vitals:  Vitals:   09/14/16 1645 09/14/16 1700  BP: 132/89 (!) 167/102  Pulse: 71 71  Resp: 13 16  Temp:  36.3 C    Last Pain:  Vitals:   09/15/16 1031  TempSrc:   PainSc: 0-No pain                 Marisue Canion,JAMES TERRILL

## 2017-01-21 NOTE — Addendum Note (Signed)
Addendum  created 01/21/17 0951 by Hadyn Blanck, MD   Sign clinical note    

## 2017-10-06 IMAGING — CR DG FINGER THUMB 2+V*L*
5 series · 5 of 5 positions shown · non-contrast
Comparison: None.

CLINICAL DATA: Patient fell launch follicle, lacerated artery just
proximal to the proximal interphalangeal joint.

EXAM:
LEFT THUMB 2+V

[x finger obl. left]
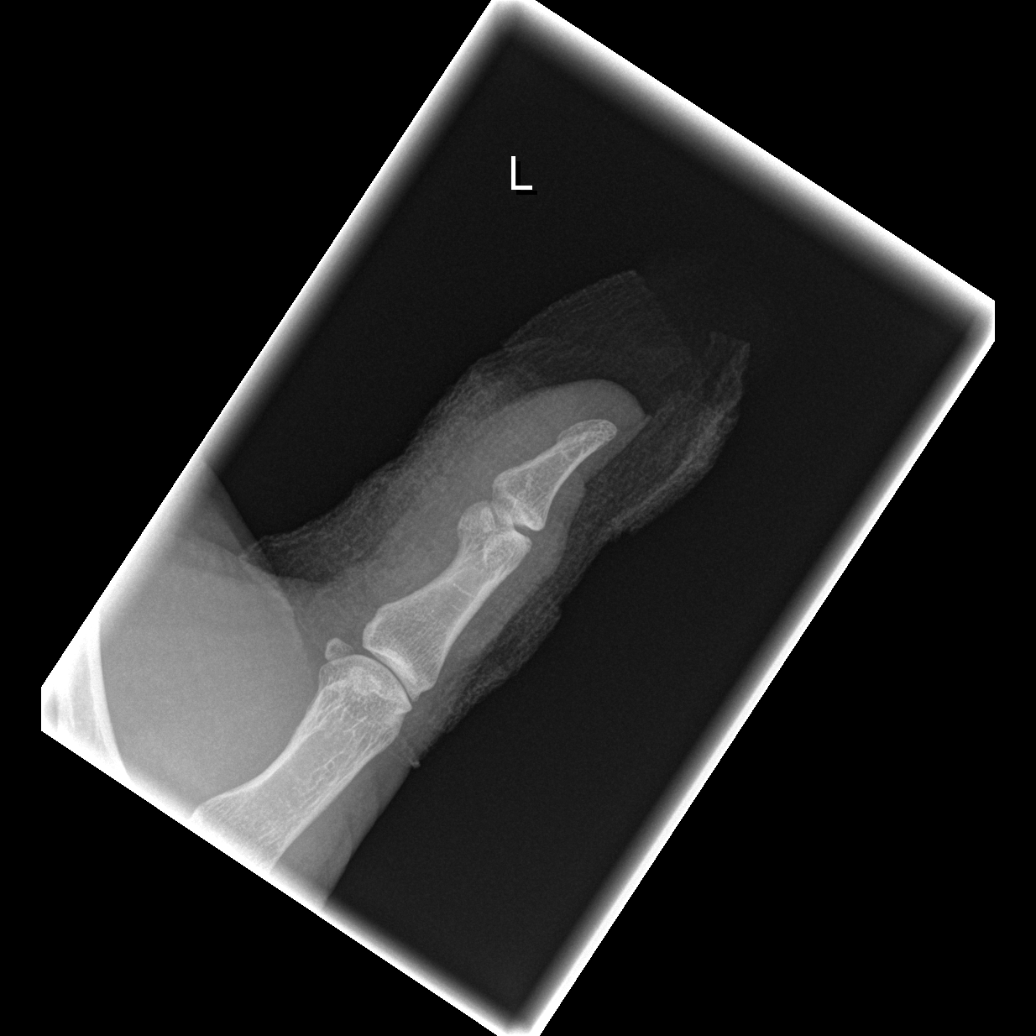

[x finger lateral left]
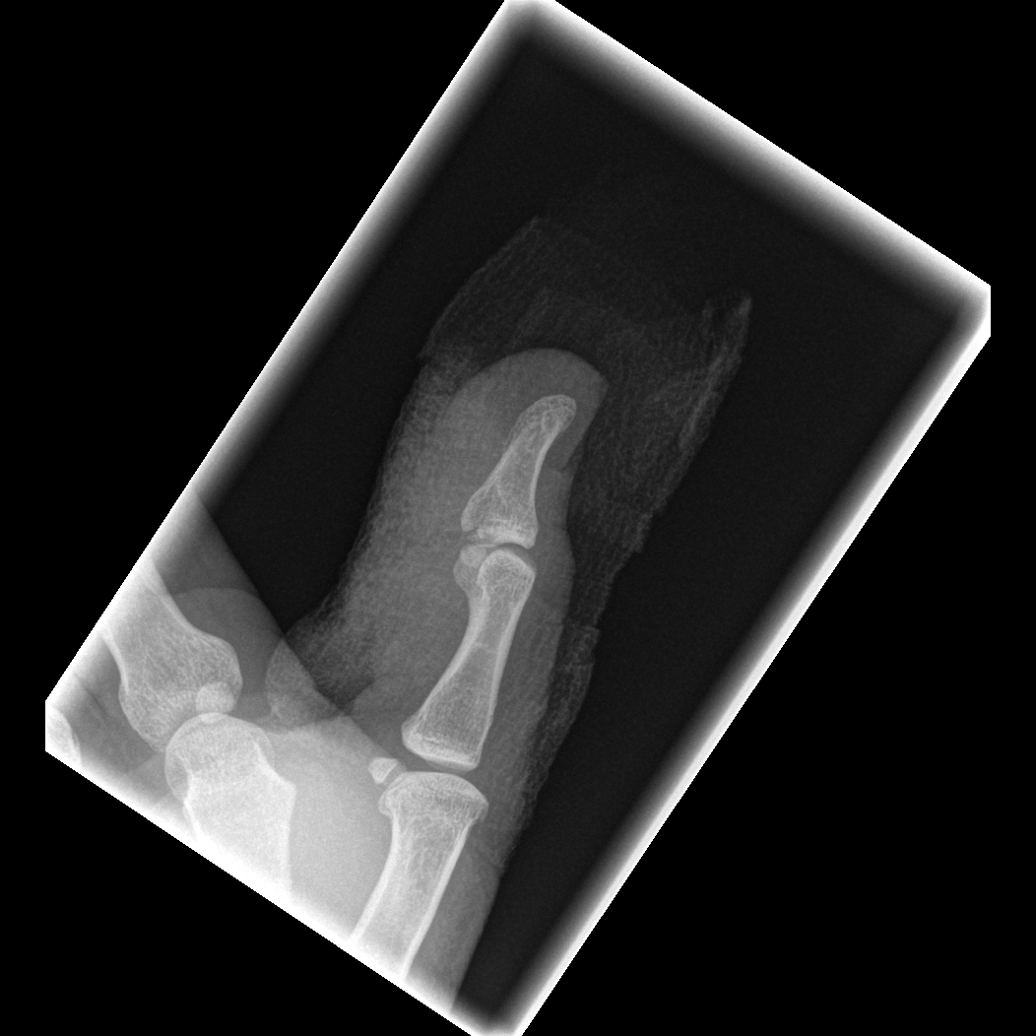

[x finger pa left (1 of 3)]
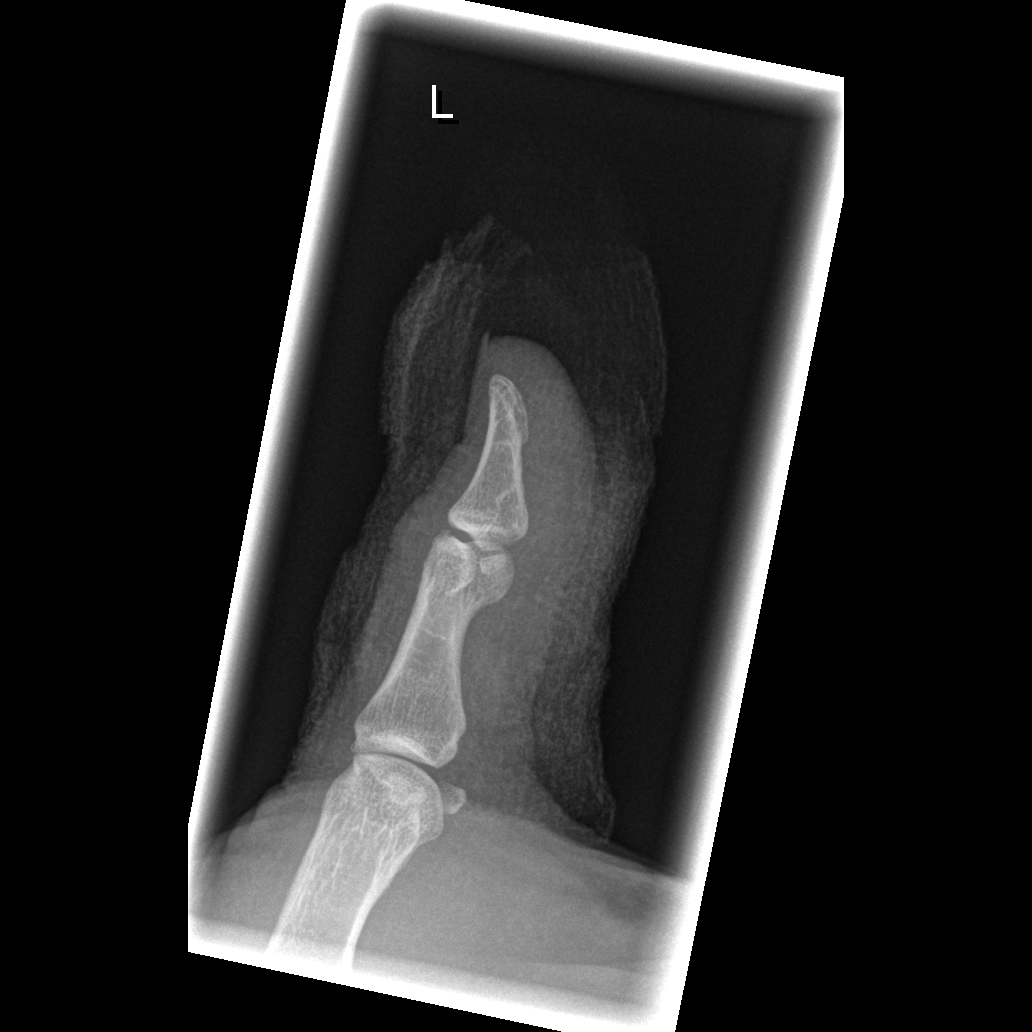

[x finger pa left (2 of 3)]
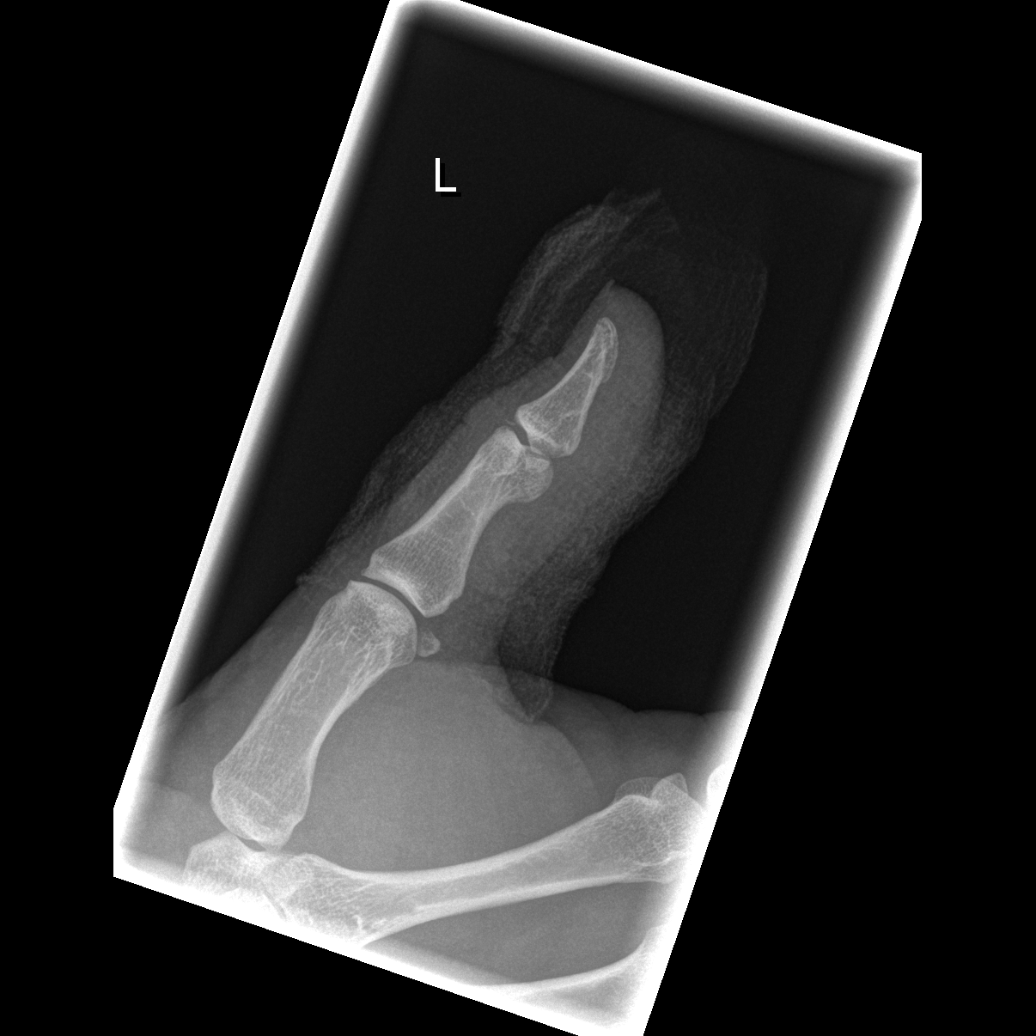

[x finger pa left (3 of 3)]
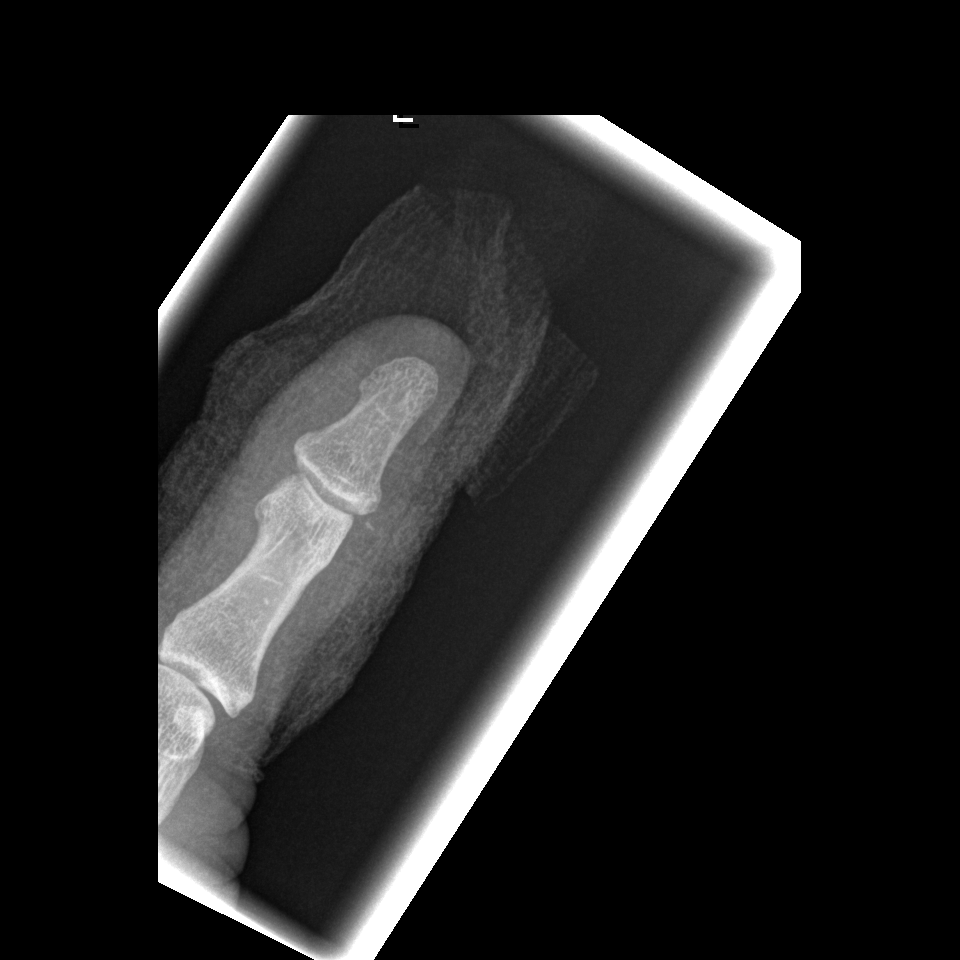

[5 of 5 positions shown; findings below may reference images not displayed]

FINDINGS: There is a fracture through the proximal ventral aspect of the
distal first phalanx best seen on an oblique view. I suspect the
fracture is comminuted. There is a radiodensity projected over the
soft tissues adjacent to the distal interphalangeal joint, best seen
on the final AP view. This could represent a foreign body or tiny
bony fragment. Recommend clinical correlation.
IMPRESSION: 1. There is a fracture through the distal first phalanx which is
comminuted, not significantly displaced, and best seen on lateral
and oblique imaging through the ventral aspect of the phalanx.
2. Tiny bony fragment versus foreign body in the soft tissues
adjacent to the distal interphalangeal joint.
